# Patient Record
Sex: Female | Born: 1990 | Race: White | Hispanic: No | Marital: Married | Smoking: Never smoker
Health system: Southern US, Community
[De-identification: ages and names within clinical notes are randomized; demographics above are authoritative.]

## PROBLEM LIST (undated history)

## (undated) DIAGNOSIS — R109 Unspecified abdominal pain: Secondary | ICD-10-CM

## (undated) DIAGNOSIS — Z789 Other specified health status: Secondary | ICD-10-CM

## (undated) HISTORY — DX: Other specified health status: Z78.9

## (undated) HISTORY — PX: WISDOM TOOTH EXTRACTION: SHX21

## (undated) HISTORY — DX: Unspecified abdominal pain: R10.9

---

## 2014-04-03 ENCOUNTER — Ambulatory Visit (INDEPENDENT_AMBULATORY_CARE_PROVIDER_SITE_OTHER): Payer: BC Managed Care – PPO | Admitting: Internal Medicine

## 2014-04-03 ENCOUNTER — Other Ambulatory Visit (INDEPENDENT_AMBULATORY_CARE_PROVIDER_SITE_OTHER): Payer: Self-pay | Admitting: Internal Medicine

## 2014-04-03 ENCOUNTER — Encounter (INDEPENDENT_AMBULATORY_CARE_PROVIDER_SITE_OTHER): Payer: Self-pay | Admitting: Internal Medicine

## 2014-04-03 VITALS — BP 112/70 | HR 74 | Temp 97.3°F | Resp 18 | Ht 64.0 in | Wt 184.7 lb

## 2014-04-03 DIAGNOSIS — R1011 Right upper quadrant pain: Secondary | ICD-10-CM

## 2014-04-03 MED ORDER — DEXLANSOPRAZOLE 60 MG PO CPDR: 60.0000 mg | DELAYED_RELEASE_CAPSULE | Freq: Every day | ORAL | Status: DC

## 2014-04-03 NOTE — Patient Instructions (Signed)
Hemoccult 1 Call if abdominal pain gets worse. Physician will call with stool test results.

## 2014-04-04 ENCOUNTER — Encounter (INDEPENDENT_AMBULATORY_CARE_PROVIDER_SITE_OTHER): Payer: Self-pay | Admitting: Internal Medicine

## 2014-04-04 ENCOUNTER — Telehealth (INDEPENDENT_AMBULATORY_CARE_PROVIDER_SITE_OTHER): Payer: Self-pay | Admitting: *Deleted

## 2014-04-04 ENCOUNTER — Other Ambulatory Visit (INDEPENDENT_AMBULATORY_CARE_PROVIDER_SITE_OTHER): Payer: Self-pay | Admitting: *Deleted

## 2014-04-04 ENCOUNTER — Ambulatory Visit (INDEPENDENT_AMBULATORY_CARE_PROVIDER_SITE_OTHER): Payer: Self-pay | Admitting: Internal Medicine

## 2014-04-04 ENCOUNTER — Encounter (INDEPENDENT_AMBULATORY_CARE_PROVIDER_SITE_OTHER): Payer: Self-pay | Admitting: *Deleted

## 2014-04-04 NOTE — Progress Notes (Signed)
Presenting complaint;  Right-sided abdominal pain of 12 days duration.  History of present illness;  Patient is 23 year old Caucasian female whose father, patient of ours called and requested her to be seen this week. Patient was in usual state of health until 12 days ago when she woke with pain in right mid abdomen and she also felt lightheaded and did not feel well. She did not experience nausea vomiting fever or chills. Pain continued over the next 4 days and seemed to radiate up to under the right rib cage. She was seen by a physician at [facility] on 03/26/2014 and returned the following day as she was not feeling better. She had urinalysis and lab studies which were unremarkable. These are reviewed under lab data. She also had abdominal ultrasound 03/31/2014 and this study was unremarkable. In the meantime patient has remained with right-sided abdominal pain. Pain was minimal yesterday. Now it is experienced mainly in right upper quadrant. At times she has felt lightheaded better mother stays she has had syncopal episodes in the past when she is sick or stressed out. She was advised by her physician Dr. Clabe SealVimmerstedt to hold birth control pills. She started with it. 5 days ago. She reports decrease in her appetite but she has not lost any weight. He denies heartburn or dysphagia. Her bowels been moving regularly and she denies melena or rectal bleeding dysuria or hematuria. She feels pain may be more pronounced after meals. She has been using ibuprofen on when necessary basis no more than 1 or 2 tablets a day. Review of the systems is negative for skin rash sore throat headache or aches and pains. She is on birth control pills to prevent cramps.  Current Medications: Outpatient Encounter Prescriptions as of 04/03/2014  Medication Sig  . ibuprofen (ADVIL,MOTRIN) 200 MG tablet Take 200 mg by mouth as needed.  . norethindrone-ethinyl estradiol (JUNEL FE,GILDESS FE,LOESTRIN FE) 1-20  MG-MCG tablet Take 1 tablet by mouth daily.  . phenylephrine (SUDAFED PE) 10 MG TABS tablet Take 10 mg by mouth as needed.  Marland Kitchen. dexlansoprazole (DEXILANT) 60 MG capsule Take 1 capsule (60 mg total) by mouth daily.   Past medical history; She had wisdom teeth extracted at age 23.  Allergies; No Known Allergies  Family history; Father has chronic GERD and mother is in good health. She has a brother who is also in good health.  Social history; She is single and she is senior at [employer]. She does not smoke cigarettes or drink alcohol. She exercises at least twice a week using elliptical or treadmill.  Physical examination;  Blood pressure 112/70, pulse 74, temperature 97.3 F (36.3 C), temperature source Oral, resp. rate 18, height 5\' 4"  (1.626 m), weight 184 lb 11.2 oz (83.779 kg), last menstrual period 03/28/2014. Patient is alert and in no acute distress. Conjunctiva is pink. Sclera is nonicteric Oropharyngeal mucosa is normal. No neck masses or thyromegaly noted. Cardiac exam with regular rhythm normal S1 and S2. No murmur or gallop noted. Lungs are clear to auscultation. Abdomen is full. Bowel sounds are normal. On palpation abdomen is soft with mild tenderness in midepigastric region and below the right costal margin. No tenderness noted right lower quadrant. No organomegaly or masses. No LE edema or clubbing noted.  Labs/studies Results: Lab data from 03/27/2014  Urinalysis negative for protein, nitrate, leukocytes . Few wbc's but no RBCs.  WBC 7.78, H&H 13.3 and 38.5 and platelet count 222K Glucose 115  BUN 10, creatinine  0.71  Serum sodium 140, potassium 4.5, chloride 103, CO2 21  Serum calcium 9.2.  Bilirubin 0.4, AP 60, AST 13, ALT 10, total protein 6.8 and albumin 4.6.   Ultrasound from 03/31/2014 negative for cholelithiasis or other abnormalities(verbal report).   Assessment:  Patient is a healthy 23 year old Caucasian female who presents with 12 day  history of right-sided abdominal pain which was in right midabdomen to begin within normal in right upper quadrant. She has mild tenderness in epigastric region and right upper quadrant. She uses ibuprofen on when necessary basis. There is no history of melena or rectal bleeding fever or chills. CBC LFTs and serum calcium from last week are within normal limits. He does not have any symptoms suggest viral illness. She could have peptic ulcer disease or battery dyskinesia.   Plan:  Hemoccult 1. Stool antigen for H. Pylori. Dexilant 60 mg by mouth every morning. 2 week supply given along with prescription. She can resume birth control pills. If pain gets first she will give us a call and we will proceed with abdominopelvic CT. Patient will call with progress report before she goes back to school on 04/09/2014.

## 2014-04-04 NOTE — Telephone Encounter (Signed)
   Diagnosis:    Result(s)   Card 1:Negative:           Completed by: Telford Archambeau , LPN   HEMOCCULT SENSA DEVELOPER: LOT#:  [id number] EXPIRATION DATE: 2016-05   HEMOCCULT SENSA CARD:  LOT#: 02/14  EXPIRATION DATE: 07/18   CARD CONTROL RESULTS:  POSITIVE: Positive NEGATIVE: Negative    ADDITIONAL COMMENTS: Patient was called and made aware of her result.  

## 2014-04-05 LAB — HELICOBACTER PYLORI  SPECIAL ANTIGEN: H. PYLORI Antigen: NEGATIVE

## 2014-04-09 ENCOUNTER — Encounter (INDEPENDENT_AMBULATORY_CARE_PROVIDER_SITE_OTHER): Payer: Self-pay

## 2014-04-09 NOTE — Telephone Encounter (Signed)
Result noted; stool is guaiac-negative.

## 2014-04-10 ENCOUNTER — Telehealth (INDEPENDENT_AMBULATORY_CARE_PROVIDER_SITE_OTHER): Payer: Self-pay | Admitting: *Deleted

## 2014-04-10 NOTE — Telephone Encounter (Signed)
Dr.Rehman is calling patient. 

## 2014-04-10 NOTE — Telephone Encounter (Signed)
Vickie Sanchez called and said she was told by Dr. Karilyn Cotaehman to call Vickie Sanchez. Vickie Sanchez, the father, has also called and asked for Dr. Karilyn Cotaehman to call his daughter, Vickie Sanchez, at [phone].

## 2014-04-25 ENCOUNTER — Telehealth (INDEPENDENT_AMBULATORY_CARE_PROVIDER_SITE_OTHER): Payer: Self-pay | Admitting: *Deleted

## 2014-04-25 NOTE — Telephone Encounter (Signed)
Patient's Insurance states that the Dexilant is a non Preferred. They ask that she try 1 of the preferred PPI's Omeprazole , Nexium, Prilosec OTC. Per Dr.Rehman the patient may try the Omeprazole OTC take 1 by mouth every day. A message was left on the parent's home phone.

## 2015-01-18 ENCOUNTER — Telehealth (INDEPENDENT_AMBULATORY_CARE_PROVIDER_SITE_OTHER): Payer: Self-pay | Admitting: Internal Medicine

## 2015-01-18 NOTE — Telephone Encounter (Signed)
Vickie Sanchez, If she has been seen at [facility] recently, would like Korea, labs, etc. Not in computer.

## 2015-01-21 ENCOUNTER — Ambulatory Visit (INDEPENDENT_AMBULATORY_CARE_PROVIDER_SITE_OTHER): Payer: BC Managed Care – PPO | Admitting: Internal Medicine

## 2015-01-21 ENCOUNTER — Other Ambulatory Visit (INDEPENDENT_AMBULATORY_CARE_PROVIDER_SITE_OTHER): Payer: Self-pay | Admitting: *Deleted

## 2015-01-21 ENCOUNTER — Encounter (INDEPENDENT_AMBULATORY_CARE_PROVIDER_SITE_OTHER): Payer: Self-pay | Admitting: *Deleted

## 2015-01-21 ENCOUNTER — Encounter (INDEPENDENT_AMBULATORY_CARE_PROVIDER_SITE_OTHER): Payer: Self-pay | Admitting: Internal Medicine

## 2015-01-21 VITALS — BP 128/68 | HR 68 | Temp 98.2°F | Ht 64.0 in | Wt 192.8 lb

## 2015-01-21 DIAGNOSIS — R1010 Upper abdominal pain, unspecified: Secondary | ICD-10-CM | POA: Diagnosis not present

## 2015-01-21 DIAGNOSIS — R1011 Right upper quadrant pain: Principal | ICD-10-CM

## 2015-01-21 DIAGNOSIS — G8929 Other chronic pain: Secondary | ICD-10-CM

## 2015-01-21 MED ORDER — HYDROCODONE-ACETAMINOPHEN 5-325 MG PO TABS
1.0000 | ORAL_TABLET | Freq: Four times a day (QID) | ORAL | Status: DC | PRN
Start: 2015-01-21 — End: 2015-01-21

## 2015-01-21 MED ORDER — HYDROCODONE-ACETAMINOPHEN 5-325 MG PO TABS: 1.0000 | ORAL_TABLET | Freq: Four times a day (QID) | ORAL | Status: DC | PRN

## 2015-01-21 NOTE — Patient Instructions (Signed)
HIDA scan. Further recommendation to follow.   

## 2015-01-21 NOTE — Progress Notes (Addendum)
Subjective:    Patient ID: Vickie Sanchez, female    DOB: 1990-05-27, 24 y.o.   MRN: [id number]  HPI  Here today for f/u.  She presents today with c/o epigastric cramps.  If she lies on her left side, she has pain. When she is sitting, the pain is worse. The pain is constant.  She has had this pain since Thursday. Nothing really provoke the pain. Tylenol or Zantac has not helped.  Her appetite is normal. She has gained from 184 in November 2015 to 192.8 today.  She has some nausea but no vomiting. She did feel faint Thursday.  She denies acid reflux. Her BMs are normal. No melena or BRRB.  No family hx of GB disease.    She was last seen by Dr. Karilyn Cota in November of 2015 for RUQ pain. Recently seen at [facility] and underwent an [place] rt upper quadrant which was normal.  Has been seen at [facility] on 03/27/2015. She had urine and labs studies which were normal per Dr. Karilyn Cota notes. [place] 04/01/2015 was normal.     weight 184 04/04/2013 LMP:     US Gallbladder or RUQ (01/17/2015 4:45 PM)  Narrative  Exam: Ultrasound of the Right Upper Quadrant    History:] Quadrant pain and nausea    Technique: Real-time ultrasound of the right upper quadrant.    Comparison: None    Findings:     The liver is of normal size and echotexture. There are no focal masses or intrahepatic biliary ductal dilatation. Normal direction of flow is demonstrated within the main portal vein. The gallbladder is normal with no stones, wall thickening or pericholecystic fluid. Negative sonographic Murphy sign. The common duct measures 3.79mm with no filling defects. The pancreas appears normal as visualized.    The visualized portions of the aorta and IVC are normal in caliber. The right kidney is of normal size and echotexture. It measures 11.0 cm in length. There is no evidence of stones,solid masses or hydronephrosis. There is no right upper quadrant free fluid.          Labs/studies Results: Lab data from 03/27/2014  Urinalysis negative for protein, nitrate, leukocytes . Few wbc's but no RBCs.  WBC 7.78, H&H 13.3 and 38.5 and platelet count 222K Glucose 115 , Albumin 406, total bili 0.4, ALP 60, AST 13, ALT 10 BUN 10, creatinine 0.71  Serum sodium 140, potassium 4.5, chloride 103, CO2 21  Serum calcium 9.2.  Bilirubin 0.4, AP 60, AST 13, ALT 10, total protein 6.8 and albumin 4.6.   Ultrasound from 03/31/2014 negative for cholelithiasis or other abnormalities(verbal report).  Review of Systems Past Medical History  Diagnosis Date  . Abdominal pain     Past Surgical History  Procedure Laterality Date  . Wisdom tooth extraction      No Known Allergies  Current Outpatient Prescriptions on File Prior to Visit  Medication Sig Dispense Refill  . ibuprofen (ADVIL,MOTRIN) 200 MG tablet Take 200 mg by mouth as needed.    . norethindrone-ethinyl estradiol (JUNEL FE,GILDESS FE,LOESTRIN FE) 1-20 MG-MCG tablet Take 1 tablet by mouth daily.     No current facility-administered medications on file prior to visit.        Objective:   Physical Exam Blood pressure 128/68, pulse 68, temperature 98.2 F (36.8 C), height  (1.626 m), weight 192 lb 12.8 oz (87.454 kg). Alert and oriented. Skin warm and dry. Oral mucosa is moist.   . Sclera anicteric,  conjunctivae is pink. Thyroid not enlarged. No cervical lymphadenopathy. Lungs clear. Heart regular rate and rhythm.  Abdomen is soft. Bowel sounds are positive. No hepatomegaly. No abdominal masses felt. Tenderness rt upper quadrant.  No edema to lower extremities.          Assessment & Plan:  Rt upper quadrant with normal recent US per patient. She report recent labs were also normal. Will get a HIDA scan. Rx for Hydrocodone. If HIDA scan normal: may need an EGD.

## 2015-01-21 NOTE — Telephone Encounter (Signed)
  Per Delrae Rend - patient to have Hydrocodone/Acetam/3/325 mg - Take 1 by mouth every 6 hours prn pain #20 no refills.

## 2015-01-22 ENCOUNTER — Encounter (HOSPITAL_COMMUNITY): Payer: Self-pay

## 2015-01-22 ENCOUNTER — Other Ambulatory Visit (INDEPENDENT_AMBULATORY_CARE_PROVIDER_SITE_OTHER): Payer: Self-pay | Admitting: Internal Medicine

## 2015-01-22 ENCOUNTER — Encounter (HOSPITAL_COMMUNITY)
Admission: RE | Admit: 2015-01-22 | Discharge: 2015-01-22 | Disposition: A | Discharge status: Home / Self Care | Payer: BC Managed Care – PPO | Source: Ambulatory Visit | Attending: Internal Medicine | Admitting: Internal Medicine

## 2015-01-22 DIAGNOSIS — G8929 Other chronic pain: Secondary | ICD-10-CM | POA: Insufficient documentation

## 2015-01-22 DIAGNOSIS — R1011 Right upper quadrant pain: Secondary | ICD-10-CM

## 2015-01-22 DIAGNOSIS — R1010 Upper abdominal pain, unspecified: Secondary | ICD-10-CM | POA: Diagnosis not present

## 2015-01-22 MED ORDER — SODIUM CHLORIDE 0.9 % IJ SOLN
INTRAMUSCULAR | Status: AC
  Filled 2015-01-22: qty 36

## 2015-01-22 MED ORDER — TECHNETIUM TC 99M MEBROFENIN IV KIT
5.0000 | PACK | Freq: Once | INTRAVENOUS | Status: DC | PRN
  Administered 2015-01-22: 5 via INTRAVENOUS
  Filled 2015-01-22: qty 6

## 2015-01-22 MED ORDER — SINCALIDE 5 MCG IJ SOLR
INTRAMUSCULAR | Status: AC
  Administered 2015-01-22: 1.75 ug via INTRAVENOUS
  Filled 2015-01-22: qty 5

## 2015-01-22 MED ORDER — STERILE WATER FOR INJECTION IJ SOLN
INTRAMUSCULAR | Status: AC
  Administered 2015-01-22: 1.75 mL via INTRAVENOUS
  Filled 2015-01-22: qty 10

## 2015-01-23 ENCOUNTER — Ambulatory Visit (HOSPITAL_COMMUNITY)
Admission: RE | Admit: 2015-01-23 | Discharge: 2015-01-23 | Disposition: A | Discharge status: Home / Self Care | Payer: BC Managed Care – PPO | Source: Ambulatory Visit | Attending: Internal Medicine | Admitting: Internal Medicine

## 2015-01-23 DIAGNOSIS — R1011 Right upper quadrant pain: Secondary | ICD-10-CM | POA: Insufficient documentation

## 2015-01-23 MED ORDER — IOHEXOL 300 MG/ML  SOLN
100.0000 mL | Freq: Once | INTRAMUSCULAR | Status: AC | PRN
  Administered 2015-01-23: 100 mL via INTRAVENOUS

## 2015-01-24 ENCOUNTER — Encounter (HOSPITAL_COMMUNITY): Payer: BC Managed Care – PPO

## 2015-01-24 ENCOUNTER — Telehealth (INDEPENDENT_AMBULATORY_CARE_PROVIDER_SITE_OTHER): Payer: Self-pay | Admitting: *Deleted

## 2015-01-24 ENCOUNTER — Other Ambulatory Visit (INDEPENDENT_AMBULATORY_CARE_PROVIDER_SITE_OTHER): Payer: Self-pay | Admitting: Internal Medicine

## 2015-01-24 DIAGNOSIS — R1011 Right upper quadrant pain: Secondary | ICD-10-CM

## 2015-01-24 NOTE — Telephone Encounter (Addendum)
Mr. Blinn said he was told by Dr. Karilyn Cota, if the 1/2 tablet pain pill didn't work then he would give her something else. It still makes her sick. Would like to get something new called into [facility] at [facility]. [place]'s return phone number is [phone], others is [phone]. Please let them know weather or not this has been done or not.   [address] [place] [address] [phone]

## 2015-01-24 NOTE — Telephone Encounter (Signed)
Per Dorene Ar, NP, she is not needing the records she requested. They were found through care everywhere.

## 2015-01-24 NOTE — Telephone Encounter (Signed)
Patient will need to come by and sign a Medical Release form for records.

## 2015-01-25 ENCOUNTER — Other Ambulatory Visit (INDEPENDENT_AMBULATORY_CARE_PROVIDER_SITE_OTHER): Payer: Self-pay | Admitting: Internal Medicine

## 2015-01-25 DIAGNOSIS — R1011 Right upper quadrant pain: Secondary | ICD-10-CM

## 2015-01-25 MED ORDER — TRAMADOL HCL 50 MG PO TABS: 50.0000 mg | ORAL_TABLET | Freq: Four times a day (QID) | ORAL | Status: DC | PRN

## 2015-01-25 NOTE — Telephone Encounter (Signed)
Rx was faxed to [phone] and the direct pharmacy phone number is [phone].

## 2015-01-25 NOTE — Telephone Encounter (Signed)
Rx given to Lupita Leash to fax to her pharmacy.  Lupita Leash, please call patient.

## 2015-02-19 ENCOUNTER — Telehealth (INDEPENDENT_AMBULATORY_CARE_PROVIDER_SITE_OTHER): Payer: Self-pay | Admitting: *Deleted

## 2015-02-19 NOTE — Telephone Encounter (Signed)
FYI: Dr Lovell Sheehan office called, patient's father canceled her appt with their office today

## 2015-02-19 NOTE — Telephone Encounter (Signed)
Thanks

## 2016-01-17 IMAGING — NM NM HEPATO W/GB/PHARM/[PERSON_NAME]
2 series · 12 of 12 positions shown · non-contrast
Comparison: None.

CLINICAL DATA: Chronic RIGHT upper quadrant abdominal pain with
nausea and vomiting

EXAM:
NUCLEAR MEDICINE HEPATOBILIARY IMAGING WITH GALLBLADDER EF
TECHNIQUE: Sequential images of the abdomen were obtained [DATE] minutes
following intravenous administration of radiopharmaceutical. After
slow intravenous infusion of 1.75 micrograms Cholecystokinin,
gallbladder ejection fraction was determined.
RADIOPHARMACEUTICALS:  5 mCi Gc-QQm Choletec IV

[Series 1: biliary · 3.25mm/px · 6 of 60 frames shown]
[frame 6/60]
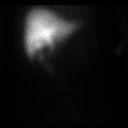
[frame 16/60]
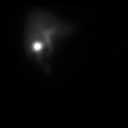
[frame 26/60]
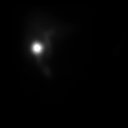
[frame 36/60]
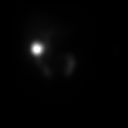
[frame 46/60]
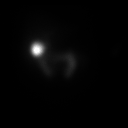
[frame 56/60]
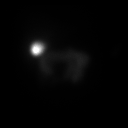

[Series 2: gbef · 3.25mm/px · 6 of 60 frames shown]
[frame 6/60]
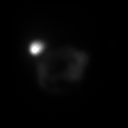
[frame 16/60]
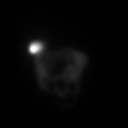
[frame 26/60]
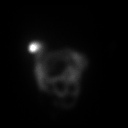
[frame 36/60]
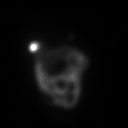
[frame 46/60]
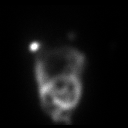
[frame 56/60]
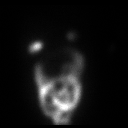

[12 of 12 positions shown; findings below may reference images not displayed]

FINDINGS: Normal tracer extraction from bloodstream indicating normal
hepatocellular function.

Normal excretion of tracer into biliary tree.

Gallbladder visualized at 12 min.

Small bowel visualized at 10 min.

No hepatic retention of tracer.

Subjectively normal emptying of tracer from gallbladder following
CCK administration.

Calculated gallbladder ejection fraction is 86% , normal.

Patient reported abdominal pain and nausea following CCK
administration.
IMPRESSION: Patent biliary tree.

Normal gallbladder response to CCK stimulation with normal
gallbladder ejection fraction of 86%.

Patient reported abdominal pain and nausea following CCK

## 2022-07-20 ENCOUNTER — Encounter: Payer: Self-pay | Admitting: Family Medicine

## 2022-07-20 ENCOUNTER — Ambulatory Visit: Payer: Managed Care, Other (non HMO) | Admitting: Family Medicine

## 2022-07-20 VITALS — BP 122/78 | HR 93 | Temp 97.9°F | Ht 64.0 in | Wt 221.5 lb

## 2022-07-20 DIAGNOSIS — Z114 Encounter for screening for human immunodeficiency virus [HIV]: Secondary | ICD-10-CM | POA: Diagnosis not present

## 2022-07-20 DIAGNOSIS — Z0000 Encounter for general adult medical examination without abnormal findings: Secondary | ICD-10-CM | POA: Diagnosis not present

## 2022-07-20 DIAGNOSIS — Z30011 Encounter for initial prescription of contraceptive pills: Secondary | ICD-10-CM

## 2022-07-20 MED ORDER — NORETHIN ACE-ETH ESTRAD-FE 1-20 MG-MCG PO TABS: 1.0000 | ORAL_TABLET | Freq: Every day | ORAL | 3 refills | Status: DC

## 2022-07-20 NOTE — Patient Instructions (Signed)
Give us 2-3 business days to get the results of your labs back.   Keep the diet clean and stay active.  Let us know if you need anything. 

## 2022-07-20 NOTE — Progress Notes (Signed)
Chief Complaint  Patient presents with   New Patient (Initial Visit)    Needs a prescription for Birth Control Pills.     Well Woman Vickie Sanchez is here for a complete physical.   Her last physical was >1 year ago.  Current diet: in general, a "healthy" diet. Current exercise: walking. Contraception? Yes Fatigue out of ordinary? No Seatbelt? Yes Advanced directive? No  Health Maintenance Pap/HPV- Yes Tetanus- Yes HIV screening- No Hep C screening- Yes  Past Medical History:  Diagnosis Date   No known health problems      Past Surgical History:  Procedure Laterality Date   WISDOM TOOTH EXTRACTION      Medications  OCPs  Allergies No Known Allergies  Review of Systems: Constitutional:  no unexpected weight changes Eye:  no recent significant change in vision Ear/Nose/Mouth/Throat:  Ears:  no tinnitus or vertigo and no recent change in hearing Nose/Mouth/Throat:  no complaints of nasal congestion, no sore throat Cardiovascular: no chest pain Respiratory:  no cough and no shortness of breath Gastrointestinal:  no abdominal pain, no change in bowel habits GU:  Female: negative for dysuria or pelvic pain Musculoskeletal/Extremities:  no pain of the joints Integumentary (Skin/Breast):  no abnormal skin lesions reported Neurologic:  no headaches Endocrine:  denies fatigue Hematologic/Lymphatic:  No areas of easy bleeding  Exam BP 122/78 (BP Location: Left Arm, Patient Position: Sitting, Cuff Size: Large)   Pulse 93   Temp 97.9 F (36.6 C) (Oral)   Ht '5\' 4"'$  (1.626 m)   Wt 221 lb 8 oz (100.5 kg)   SpO2 98%   BMI 38.02 kg/m  General:  well developed, well nourished, in no apparent distress Skin:  no significant moles, warts, or growths Head:  no masses, lesions, or tenderness Eyes:  pupils equal and round, sclera anicteric without injection Ears:  canals without lesions, TMs shiny without retraction, no obvious effusion, no erythema Nose:  nares patent,  mucosa normal, and no drainage  Throat/Pharynx:  lips and gingiva without lesion; tongue and uvula midline; non-inflamed pharynx; no exudates or postnasal drainage Neck: neck supple without adenopathy, thyromegaly, or masses Lungs:  clear to auscultation, breath sounds equal bilaterally, no respiratory distress Cardio:  regular rate and rhythm, no bruits, no LE edema Abdomen:  abdomen soft, nontender; bowel sounds normal; no masses or organomegaly Genital: Defer to GYN Musculoskeletal:  symmetrical muscle groups noted without atrophy or deformity Extremities:  no clubbing, cyanosis, or edema, no deformities, no skin discoloration Neuro:  gait normal; deep tendon reflexes normal and symmetric Psych: well oriented with normal range of affect and appropriate judgment/insight  Assessment and Plan  Well adult exam - Plan: CBC, Comprehensive metabolic panel, Lipid panel  Encounter for initial prescription of contraceptive pills - Plan: norethindrone-ethinyl estradiol-FE (LOESTRIN FE) 1-20 MG-MCG tablet  Screening for HIV without presence of risk factors - Plan: HIV Antibody (routine testing w rflx)   Well 32 y.o. female. Counseled on diet and exercise. Advanced directive form provided today.  Other orders as above. Follow up in 1 yr or prn. The patient voiced understanding and agreement to the plan.  [place], DO 07/20/22 7:49 AM

## 2023-04-20 ENCOUNTER — Encounter: Payer: Self-pay | Admitting: Family Medicine

## 2023-04-20 ENCOUNTER — Ambulatory Visit: Payer: BC Managed Care – PPO | Admitting: Family Medicine

## 2023-04-20 VITALS — BP 134/78 | HR 111 | Temp 98.0°F | Resp 16 | Ht 64.0 in | Wt 228.6 lb

## 2023-04-20 DIAGNOSIS — J069 Acute upper respiratory infection, unspecified: Secondary | ICD-10-CM

## 2023-04-20 DIAGNOSIS — H6593 Unspecified nonsuppurative otitis media, bilateral: Secondary | ICD-10-CM

## 2023-04-20 LAB — POCT INFLUENZA A/B
Influenza A, POC: NEGATIVE
Influenza B, POC: NEGATIVE

## 2023-04-20 LAB — POC COVID19 BINAXNOW: SARS Coronavirus 2 Ag: NEGATIVE

## 2023-04-20 LAB — POCT RAPID STREP A (OFFICE): Rapid Strep A Screen: NEGATIVE

## 2023-04-20 MED ORDER — PREDNISONE 20 MG PO TABS: 40.0000 mg | ORAL_TABLET | Freq: Every day | ORAL | 0 refills | Status: AC

## 2023-04-20 NOTE — Patient Instructions (Signed)
Continue to push fluids, practice good hand hygiene, and cover your mouth if you cough.  If you start having fevers, shaking or shortness of breath, seek immediate care.  OK to take Tylenol 1000 mg (2 extra strength tabs) or 975 mg (3 regular strength tabs) every 6 hours as needed.  Don't take this while on the prednisone: Ibuprofen 400-600 mg (2-3 over the counter strength tabs) every 6 hours as needed for pain.  Consider throat lozenges, salt water gargles and an air humidifier for symptomatic care.   Let us know if you need anything.

## 2023-04-20 NOTE — Progress Notes (Signed)
Chief Complaint  Patient presents with   Sore Throat    Sore throat and headaches     Vickie Sanchez here for URI complaints.  Duration: 2 days  Associated symptoms: sinus headache, sinus congestion, rhinorrhea, ear fullness, sore throat, myalgia, and fatigue, some SOB compared to usual , slight cough Denies: sinus pain, itchy watery eyes, ear pain, ear drainage, wheezing, shortness of breath, and fevers Treatment to date: Tylenol Sick contacts: Yes; folks at work Tested neg for covid.   Past Medical History:  Diagnosis Date   No known health problems     Objective BP 134/78 (BP Location: Left Arm, Patient Position: Sitting, Cuff Size: Normal)   Pulse (!) 111   Temp 98 F (36.7 C) (Oral)   Resp 16   Ht 5\' 4"  (1.626 m)   Wt 228 lb 9.6 oz (103.7 kg)   SpO2 97%   BMI 39.24 kg/m  General: Awake, alert, appears stated age HEENT: AT, [place], ears patent b/l and TM's with serous fluid bilaterally, nares patent w/o discharge, pharynx pink and without exudates, MMM, no sinus TTP bilaterally Neck: No masses or asymmetry Heart: Tachycardic, regular rhythm Lungs: CTAB, no accessory muscle use Psych: Age appropriate judgment and insight, normal mood and affect  Middle ear effusion, bilateral - Plan: predniSONE (DELTASONE) 20 MG tablet  Upper respiratory tract infection, unspecified type - Plan: POC COVID-19, POCT Influenza A/B, POCT rapid strep A  Flu/COVID/strep negative.  5-day prednisone burst 40 mg daily for effusions.  Supportive care otherwise.  Continue to push fluids, practice good hand hygiene, cover mouth when coughing. F/u prn. If starting to experience fevers, shaking, or shortness of breath, seek immediate care. Pt voiced understanding and agreement to the plan.  Jilda Roche [place], DO 04/20/23 12:00 PM

## 2023-04-22 ENCOUNTER — Other Ambulatory Visit: Payer: Self-pay | Admitting: Family Medicine

## 2023-04-22 DIAGNOSIS — Z30011 Encounter for initial prescription of contraceptive pills: Secondary | ICD-10-CM

## 2023-08-20 ENCOUNTER — Ambulatory Visit: Admitting: Family Medicine

## 2023-08-20 ENCOUNTER — Encounter: Payer: Self-pay | Admitting: Family Medicine

## 2023-08-20 VITALS — BP 128/82 | HR 90 | Temp 98.1°F | Ht 64.0 in | Wt 231.2 lb

## 2023-08-20 DIAGNOSIS — E669 Obesity, unspecified: Secondary | ICD-10-CM | POA: Diagnosis not present

## 2023-08-20 MED ORDER — ZEPBOUND 7.5 MG/0.5ML ~~LOC~~ SOAJ: 7.5000 mg | SUBCUTANEOUS | 0 refills | Status: DC

## 2023-08-20 MED ORDER — ZEPBOUND 5 MG/0.5ML ~~LOC~~ SOAJ: 5.0000 mg | SUBCUTANEOUS | 0 refills | Status: DC

## 2023-08-20 MED ORDER — ZEPBOUND 2.5 MG/0.5ML ~~LOC~~ SOAJ: 2.5000 mg | SUBCUTANEOUS | 0 refills | Status: DC

## 2023-08-20 MED ORDER — ZEPBOUND 10 MG/0.5ML ~~LOC~~ SOAJ: 10.0000 mg | SUBCUTANEOUS | 0 refills | Status: DC

## 2023-08-20 MED ORDER — ZEPBOUND 12.5 MG/0.5ML ~~LOC~~ SOAJ: 12.5000 mg | SUBCUTANEOUS | 0 refills | Status: DC

## 2023-08-20 MED ORDER — ZEPBOUND 15 MG/0.5ML ~~LOC~~ SOAJ: 15.0000 mg | SUBCUTANEOUS | 1 refills | Status: DC

## 2023-08-20 NOTE — Progress Notes (Signed)
 Chief Complaint  Patient presents with   Weight Loss    Patient presents today for weight loss.   Annual Exam    Patient presents today for physical exam    Subjective: Patient is a 33 y.o. female here for discuss of her weight.  Pt struggling to lose weight. Diet is fair overall.  She has been walking and doing strength training.  She has lost around 4 pounds in the past 6 weeks and notices some changes on the scale.  She is frustrated with her perceived lack of improvement.  She had done Weight Watchers in the past which was beneficial.  She is not diabetic.  She has never been on medication before in the past.  She is not pregnant.  Considering trying to conceive in the next 6 to 12 months potentially.  Past Medical History:  Diagnosis Date   No known health problems     Objective: BP 128/82   Pulse 90   Temp 98.1 F (36.7 C)   Ht 5\' 4"  (1.626 m)   Wt 231 lb 3.2 oz (104.9 kg)   LMP 08/08/2023 (Exact Date)   SpO2 97%   BMI 39.69 kg/m  General: Awake, appears stated age Heart: RRR Lungs: CTAB, no rales, wheezes or rhonchi. No accessory muscle use Psych: Age appropriate judgment and insight, normal affect and mood  Assessment and Plan: Obesity (BMI 30-39.9) - Plan: tirzepatide (ZEPBOUND) 10 MG/0.5ML Pen, tirzepatide (ZEPBOUND) 12.5 MG/0.5ML Pen, tirzepatide (ZEPBOUND) 15 MG/0.5ML Pen, tirzepatide (ZEPBOUND) 2.5 MG/0.5ML Pen, tirzepatide (ZEPBOUND) 5 MG/0.5ML Pen, tirzepatide (ZEPBOUND) 7.5 MG/0.5ML Pen  Chronic, not controlled.  Start Zepbound 2.5 mg weekly and titrate up.  Told her that she would need to stop this medicine for 8 weeks prior to trying to conceive.  Counseled on diet and exercise.  Continue strength training and walking.  Consider higher intensity cardio.  We discussed referring to nutritionist which she has done and also the [place] weight loss clinic.  Also discussed phentermine which we will consider if weight loss injectables are not covered by her  insurance plan.  Tentatively follow-up in 6 weeks for physical where we will get more blood work.  May change follow-up interval based off of what is covered. The patient voiced understanding and agreement to the plan.  Jilda Roche [place], DO 08/20/23  4:44 PM

## 2023-08-20 NOTE — Patient Instructions (Signed)
 Injectable options: Foy [place], Saxenda.  Oral option: Adipex/phentermine.   Referral: [facility].  Keep the diet clean and stay active.  Let us know if you need anything.

## 2023-08-23 ENCOUNTER — Telehealth: Payer: Self-pay | Admitting: Pharmacy Technician

## 2023-08-23 ENCOUNTER — Other Ambulatory Visit (HOSPITAL_COMMUNITY): Payer: Self-pay

## 2023-08-23 ENCOUNTER — Encounter: Payer: Self-pay | Admitting: Family Medicine

## 2023-08-23 ENCOUNTER — Other Ambulatory Visit: Payer: Self-pay | Admitting: Family Medicine

## 2023-08-23 DIAGNOSIS — E669 Obesity, unspecified: Secondary | ICD-10-CM

## 2023-08-23 NOTE — Telephone Encounter (Signed)
 Pharmacy Patient Advocate Encounter   Received notification from Patient Advice Request messages that prior authorization for Zepbound 2.5mg  is required/requested.   Insurance verification completed.   The patient is insured through [facility] .   Per test claim: Her plan doesn't cover weight loss meds

## 2023-08-23 NOTE — Telephone Encounter (Signed)
 See pharmacy comment:   Pharmacy comment: Insurance requires generic substitution. NOT COVERED, $1303.

## 2023-08-24 ENCOUNTER — Encounter: Payer: Self-pay | Admitting: Family Medicine

## 2023-08-24 NOTE — Telephone Encounter (Signed)
 Vickie Mulder, DO     08/23/23 12:04 PM Plz have her reach out to ins to see what weight loss injectables they cover. Thx.   Patient was advise and stated she will contact insurance and send a list of medications.

## 2023-08-25 ENCOUNTER — Other Ambulatory Visit: Payer: Self-pay | Admitting: Family Medicine

## 2023-08-25 MED ORDER — PHENTERMINE HCL 37.5 MG PO CAPS: 37.5000 mg | ORAL_CAPSULE | ORAL | 0 refills | Status: DC

## 2023-08-25 NOTE — Telephone Encounter (Signed)
 Called pt appt scheduled for 1 month weight check.

## 2023-09-24 ENCOUNTER — Ambulatory Visit: Payer: Self-pay | Admitting: Family Medicine

## 2023-09-24 ENCOUNTER — Ambulatory Visit: Admitting: Family Medicine

## 2023-09-24 ENCOUNTER — Encounter: Payer: Self-pay | Admitting: Family Medicine

## 2023-09-24 VITALS — BP 122/86 | HR 74 | Temp 98.1°F | Resp 18 | Ht 64.0 in | Wt 225.0 lb

## 2023-09-24 DIAGNOSIS — E669 Obesity, unspecified: Secondary | ICD-10-CM | POA: Diagnosis not present

## 2023-09-24 DIAGNOSIS — Z0000 Encounter for general adult medical examination without abnormal findings: Secondary | ICD-10-CM

## 2023-09-24 LAB — COMPREHENSIVE METABOLIC PANEL WITH GFR
ALT: 17 U/L (ref 0–35)
AST: 15 U/L (ref 0–37)
Albumin: 4.3 g/dL (ref 3.5–5.2)
Alkaline Phosphatase: 63 U/L (ref 39–117)
BUN: 8 mg/dL (ref 6–23)
CO2: 25 meq/L (ref 19–32)
Calcium: 8.7 mg/dL (ref 8.4–10.5)
Chloride: 103 meq/L (ref 96–112)
Creatinine, Ser: 0.77 mg/dL (ref 0.40–1.20)
GFR: 102.96 mL/min (ref >60.00)
Glucose, Bld: 115 mg/dL — ABNORMAL HIGH (ref 70–99)
Potassium: 4.4 meq/L (ref 3.5–5.1)
Sodium: 137 meq/L (ref 135–145)
Total Bilirubin: 0.6 mg/dL (ref 0.2–1.2)
Total Protein: 6.7 g/dL (ref 6.0–8.3)

## 2023-09-24 LAB — LIPID PANEL
Cholesterol: 162 mg/dL (ref 0–200)
HDL: 36.8 mg/dL — ABNORMAL LOW (ref >39.00)
LDL Cholesterol: 99 mg/dL (ref 0–99)
NonHDL: 125.24
Total CHOL/HDL Ratio: 4
Triglycerides: 132 mg/dL (ref 0.0–149.0)
VLDL: 26.4 mg/dL (ref 0.0–40.0)

## 2023-09-24 LAB — CBC
HCT: 40.1 % (ref 36.0–46.0)
Hemoglobin: 13.4 g/dL (ref 12.0–15.0)
MCHC: 33.5 g/dL (ref 30.0–36.0)
MCV: 83.6 fl (ref 78.0–100.0)
Platelets: 221 10*3/uL (ref 150.0–400.0)
RBC: 4.8 Mil/uL (ref 3.87–5.11)
RDW: 12.6 % (ref 11.5–15.5)
WBC: 8.9 10*3/uL (ref 4.0–10.5)

## 2023-09-24 MED ORDER — PHENTERMINE HCL 37.5 MG PO CAPS: 37.5000 mg | ORAL_CAPSULE | ORAL | 0 refills | Status: DC

## 2023-09-24 NOTE — Progress Notes (Signed)
 Chief Complaint  Patient presents with   Follow-up     Well Woman Vickie Sanchez is here for a complete physical.   Her last physical was >1 year ago.  Current diet: in general, a "healthy" diet. Current exercise: walking, working w Systems analyst and doing Runner, broadcasting/film/video. Contraception? Yes Patient's last menstrual period was 09/06/2023 (approximate). Fatigue out of ordinary? No Seatbelt? Yes  Health Maintenance Pap/HPV- Yes Tetanus- Yes HIV screening- Yes Hep C screening- Yes  Past Medical History:  Diagnosis Date   No known health problems      Past Surgical History:  Procedure Laterality Date   WISDOM TOOTH EXTRACTION      Medications  Current Outpatient Medications on File Prior to Visit  Medication Sig Dispense Refill   norethindrone-ethinyl estradiol-FE (MICROGESTIN FE 1/20) 1-20 MG-MCG tablet Take 1 tablet by mouth daily. 84 tablet 1    Allergies No Known Allergies  Review of Systems: Constitutional:  no unexpected weight changes Eye:  no recent significant change in vision Ear/Nose/Mouth/Throat:  Ears:  no tinnitus or vertigo and no recent change in hearing Nose/Mouth/Throat:  no complaints of nasal congestion, no sore throat Cardiovascular: no chest pain Respiratory:  no cough and no shortness of breath Gastrointestinal:  no abdominal pain, no change in bowel habits GU:  Female: negative for dysuria or pelvic pain Musculoskeletal/Extremities:  no pain of the joints Integumentary (Skin/Breast):  no abnormal skin lesions reported Neurologic:  no headaches Endocrine:  denies fatigue Hematologic/Lymphatic:  No areas of easy bleeding  Exam BP 122/86   Pulse 74   Temp 98.1 F (36.7 C)   Resp 18   Ht 5\' 4"  (1.626 m)   Wt 225 lb (102.1 kg)   LMP 09/06/2023 (Approximate)   SpO2 97%   BMI 38.62 kg/m  General:  well developed, well nourished, in no apparent distress Skin:  no significant moles, warts, or growths Head:  no masses, lesions, or  tenderness Eyes:  pupils equal and round, sclera anicteric without injection Ears:  canals without lesions, TMs shiny without retraction, no obvious effusion, no erythema Nose:  nares patent, mucosa normal, and no drainage  Throat/Pharynx:  lips and gingiva without lesion; tongue and uvula midline; non-inflamed pharynx; no exudates or postnasal drainage Neck: neck supple without adenopathy, thyromegaly, or masses Lungs:  clear to auscultation, breath sounds equal bilaterally, no respiratory distress Cardio:  regular rate and rhythm, no bruits, no LE edema Abdomen:  abdomen soft, nontender; bowel sounds normal; no masses or organomegaly Genital: Defer to GYN Musculoskeletal:  symmetrical muscle groups noted without atrophy or deformity Extremities:  no clubbing, cyanosis, or edema, no deformities, no skin discoloration Neuro:  gait normal; deep tendon reflexes normal and symmetric Psych: well oriented with normal range of affect and appropriate judgment/insight  Assessment and Plan  Well adult exam - Plan: CBC, Comprehensive metabolic panel with GFR, Lipid panel  Obesity (BMI 30-39.9) - Plan: phentermine  37.5 MG capsule, phentermine  37.5 MG capsule   Well 33 y.o. female. Counseled on diet and exercise. Con 60 more d of phentermine .  Other orders as above. Follow up in 1 yr. The patient voiced understanding and agreement to the plan.  Shellie Dials [place], DO 09/24/23 7:51 AM

## 2023-09-24 NOTE — Patient Instructions (Addendum)
Keep the diet clean and stay active.  Give us 2-3 business days to get the results of your labs back.   Let us know if you need anything.  

## 2023-09-27 ENCOUNTER — Ambulatory Visit (INDEPENDENT_AMBULATORY_CARE_PROVIDER_SITE_OTHER)

## 2023-09-27 ENCOUNTER — Other Ambulatory Visit: Payer: Self-pay

## 2023-09-27 ENCOUNTER — Ambulatory Visit: Payer: Self-pay | Admitting: Family Medicine

## 2023-09-27 DIAGNOSIS — R7309 Other abnormal glucose: Secondary | ICD-10-CM

## 2023-09-27 LAB — HEMOGLOBIN A1C: Hgb A1c MFr Bld: 5.9 % (ref 4.6–6.5)

## 2023-11-23 ENCOUNTER — Encounter: Payer: Self-pay | Admitting: Family Medicine

## 2023-11-24 ENCOUNTER — Other Ambulatory Visit: Payer: Self-pay | Admitting: Family Medicine

## 2023-11-30 ENCOUNTER — Other Ambulatory Visit: Payer: Self-pay | Admitting: Family Medicine

## 2023-11-30 DIAGNOSIS — Z30011 Encounter for initial prescription of contraceptive pills: Secondary | ICD-10-CM

## 2024-02-20 ENCOUNTER — Other Ambulatory Visit: Payer: Self-pay | Admitting: Family Medicine

## 2024-02-20 DIAGNOSIS — Z30011 Encounter for initial prescription of contraceptive pills: Secondary | ICD-10-CM

## 2024-05-15 DIAGNOSIS — Z30011 Encounter for initial prescription of contraceptive pills: Secondary | ICD-10-CM
